# Patient Record
Sex: Male | Born: 1970 | Race: White | Hispanic: No | Marital: Married | Smoking: Never smoker
Health system: Southern US, Community
[De-identification: ages and names within clinical notes are randomized; demographics above are authoritative.]

---

## 2017-08-13 ENCOUNTER — Emergency Department (HOSPITAL_COMMUNITY): Payer: No Typology Code available for payment source

## 2017-08-13 ENCOUNTER — Other Ambulatory Visit: Payer: Self-pay

## 2017-08-13 ENCOUNTER — Emergency Department (HOSPITAL_COMMUNITY)
Admission: EM | Admit: 2017-08-13 | Discharge: 2017-08-13 | Disposition: A | Discharge status: Home / Self Care | Payer: No Typology Code available for payment source | Attending: Emergency Medicine | Admitting: Emergency Medicine

## 2017-08-13 ENCOUNTER — Encounter (HOSPITAL_COMMUNITY): Payer: Self-pay | Admitting: Emergency Medicine

## 2017-08-13 DIAGNOSIS — Y939 Activity, unspecified: Secondary | ICD-10-CM | POA: Insufficient documentation

## 2017-08-13 DIAGNOSIS — W319XXA Contact with unspecified machinery, initial encounter: Secondary | ICD-10-CM | POA: Insufficient documentation

## 2017-08-13 DIAGNOSIS — Y929 Unspecified place or not applicable: Secondary | ICD-10-CM | POA: Insufficient documentation

## 2017-08-13 DIAGNOSIS — Y990 Civilian activity done for income or pay: Secondary | ICD-10-CM | POA: Insufficient documentation

## 2017-08-13 DIAGNOSIS — Z23 Encounter for immunization: Secondary | ICD-10-CM | POA: Diagnosis not present

## 2017-08-13 DIAGNOSIS — S61309A Unspecified open wound of unspecified finger with damage to nail, initial encounter: Secondary | ICD-10-CM

## 2017-08-13 DIAGNOSIS — S61102A Unspecified open wound of left thumb with damage to nail, initial encounter: Secondary | ICD-10-CM | POA: Diagnosis present

## 2017-08-13 MED ORDER — IBUPROFEN 200 MG PO TABS
600.0000 mg | ORAL_TABLET | Freq: Once | ORAL | Status: AC
  Administered 2017-08-13: 600 mg via ORAL
  Filled 2017-08-13: qty 3

## 2017-08-13 MED ORDER — TETANUS-DIPHTH-ACELL PERTUSSIS 5-2.5-18.5 LF-MCG/0.5 IM SUSP
0.5000 mL | Freq: Once | INTRAMUSCULAR | Status: AC
  Administered 2017-08-13: 0.5 mL via INTRAMUSCULAR
  Filled 2017-08-13: qty 0.5

## 2017-08-13 MED ORDER — CEPHALEXIN 500 MG PO CAPS: 500.0000 mg | ORAL_CAPSULE | Freq: Four times a day (QID) | ORAL | 0 refills | Status: AC

## 2017-08-13 MED ORDER — CEPHALEXIN 500 MG PO CAPS
500.0000 mg | ORAL_CAPSULE | Freq: Once | ORAL | Status: AC
  Administered 2017-08-13: 500 mg via ORAL
  Filled 2017-08-13: qty 1

## 2017-08-13 NOTE — ED Provider Notes (Signed)
[facility]-EMERGENCY DEPT Provider Note   CSN: [id number] Arrival date & time: 08/13/17  1951     History   Chief Complaint Chief Complaint  Patient presents with  . Laceration    HPI Shane Ramsey is a 47 y.o. male who presents to the ED for an injury to the right thumb. Patient reports that he was working with a machine and it did not lock properly and when he moved his hand it came down and cut through his right thumbnail. The injury occurred approximately 1915.  HPI  History reviewed. No pertinent past medical history.  There are no active problems to display for this patient.   History reviewed. No pertinent surgical history.      Home Medications    Prior to Admission medications   Medication Sig Start Date End Date Taking? Authorizing Provider  cephALEXin (KEFLEX) 500 MG capsule Take 1 capsule (500 mg total) by mouth 4 (four) times daily. 08/13/17   Janne Napoleon, NP    Family History History reviewed. No pertinent family history.  Social History Social History   Tobacco Use  . Smoking status: Never Smoker  . Smokeless tobacco: Never Used  Substance Use Topics  . Alcohol use: Yes    Frequency: Never  . Drug use: Never     Allergies   Patient has no known allergies.   Review of Systems Review of Systems  Musculoskeletal: Positive for arthralgias.  Skin: Positive for wound.  All other systems reviewed and are negative.    Physical Exam Updated Vital Signs BP (!) 147/95 (BP Location: Left Arm)   Pulse 81   Temp 98.1 F (36.7 C) (Oral)   Resp 18   Ht 6\' 1"  (1.854 m)   Wt 81.6 kg (180 lb)   SpO2 100%   BMI 23.75 kg/m   Physical Exam  Constitutional: He appears well-developed and well-nourished. No distress.  HENT:  Head: Normocephalic.  Eyes: EOM are normal.  Neck: Neck supple.  Cardiovascular: Normal rate.  Pulmonary/Chest: Effort normal.  Musculoskeletal: Normal range of motion.       Right hand: He  exhibits tenderness, laceration and swelling. He exhibits normal range of motion. Deformity: of right thumbnail. Normal sensation noted. Normal strength noted.  Partial avulsion of the nail of the right thumb.  Neurological: He is alert.  Skin: Skin is warm and dry.  Psychiatric: He has a normal mood and affect.  Nursing note and vitals reviewed.    ED Treatments / Results  Labs (all labs ordered are listed, but only abnormal results are displayed) Labs Reviewed - No data to display  Radiology Dg Finger Thumb Left  Result Date: 08/13/2017 CLINICAL DATA:  47 year old male with laceration of the left thumb. EXAM: LEFT THUMB 2+V COMPARISON:  None. FINDINGS: There is no acute fracture or dislocation. The bones are well mineralized. No arthritic changes. The soft tissues are unremarkable. No radiopaque foreign object or soft tissue gas. IMPRESSION: Negative. Electronically Signed   By: Elgie Collard M.D.   On: 08/13/2017 21:08    Procedures: Marland KitchenMarland KitchenLaceration Repair Date/Time: 08/13/2017 9:56 PM Performed by: Janne Napoleon, NP Authorized by: Janne Napoleon, NP   Consent:    Consent obtained:  Verbal   Consent given by:  Patient   Risks discussed:  Infection and poor cosmetic result Anesthesia (see MAR for exact dosages):    Anesthesia method:  None Laceration details:    Location:  Finger   Finger location:  R thumb   Length (cm):  2 Exploration:    Wound extent: no foreign bodies/material noted, no tendon damage noted and no underlying fracture noted     Contaminated: no   Treatment:    Area cleansed with:  Saline   Amount of cleaning:  Standard   Irrigation solution:  Sterile saline Post-procedure details:    Patient tolerance of procedure:  Tolerated well, no immediate complications Comments:     After wound cleaned with NSS quick clot applied to the wound. Sterile nonstick dressing applied.    (including critical care time)  Medications Ordered in ED Medications  Tdap  (BOOSTRIX) injection 0.5 mL (has no administration in time range)  cephALEXin (KEFLEX) capsule 500 mg (has no administration in time range)  ibuprofen (ADVIL,MOTRIN) tablet 600 mg (has no administration in time range)     Initial Impression / Assessment and Plan / ED Course  I have reviewed the triage vital signs and the nursing notes. 47 y.o. male here with right thumb laceration stable for d/c without fracture or dislocation noted on x-ray and bleeding controlled. Patient to return for any problems. Referral to hand given. Will start Keflex.   Final Clinical Impressions(s) / ED Diagnoses   Final diagnoses:  Nail avulsion, finger, initial encounter    ED Discharge Orders        Ordered    cephALEXin (KEFLEX) 500 MG capsule  4 times daily     08/13/17 2150       Kerrie Buffaloeese, [place], [place] 08/13/17 2200    Loren RacerYelverton, David, MD 08/15/17 [phone]

## 2017-08-13 NOTE — Discharge Instructions (Signed)
Take tylenol and ibuprofen for pain in addition to the antibiotic. If your symptoms worse return here or follow up with Dr. Jena GaussHaddix.

## 2017-08-13 NOTE — ED Triage Notes (Signed)
Pt reports being at work and a piece of the equipment did not lock and cut right thumb nail at appx 1915 today. Bleeding controlled with dressing at this time.

## 2018-11-06 IMAGING — CR DG FINGER THUMB 2+V*L*
3 series · 3 of 3 positions shown · non-contrast
Comparison: None.

CLINICAL DATA: 47-year-old male with laceration of the left thumb.

EXAM:
LEFT THUMB 2+V

[x finger obl left]
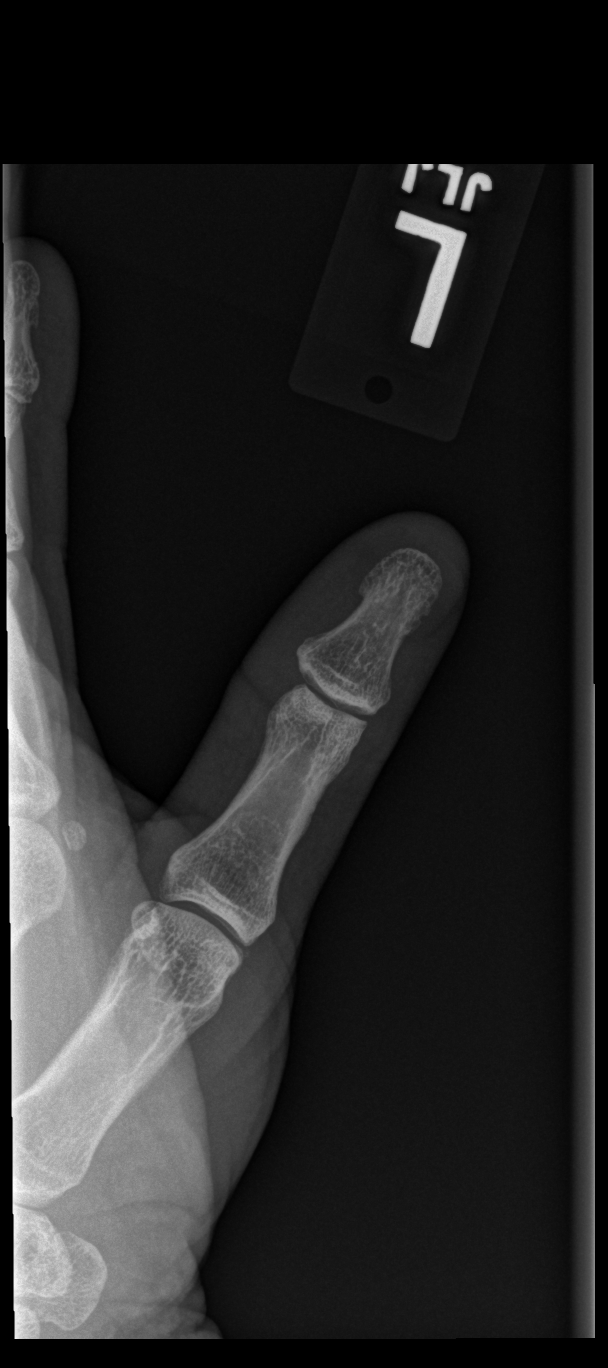

[x finger lat left (1 of 2)]
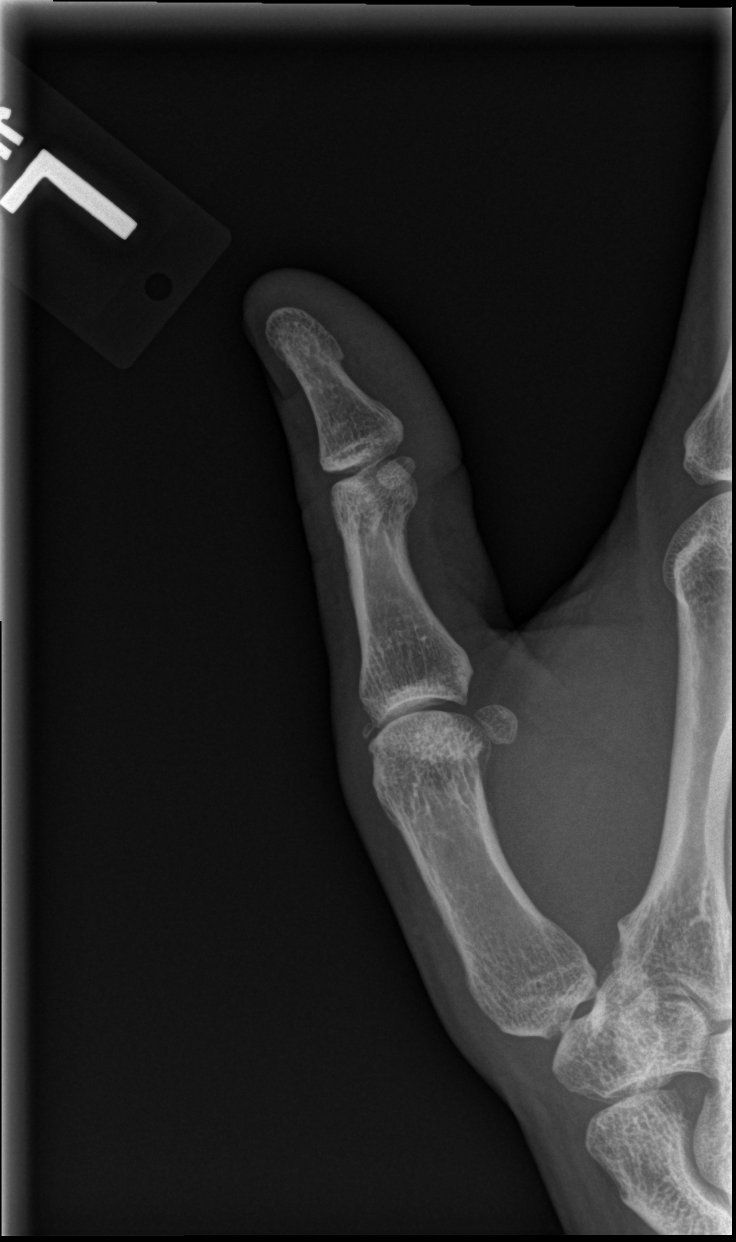

[x finger lat left (2 of 2)]
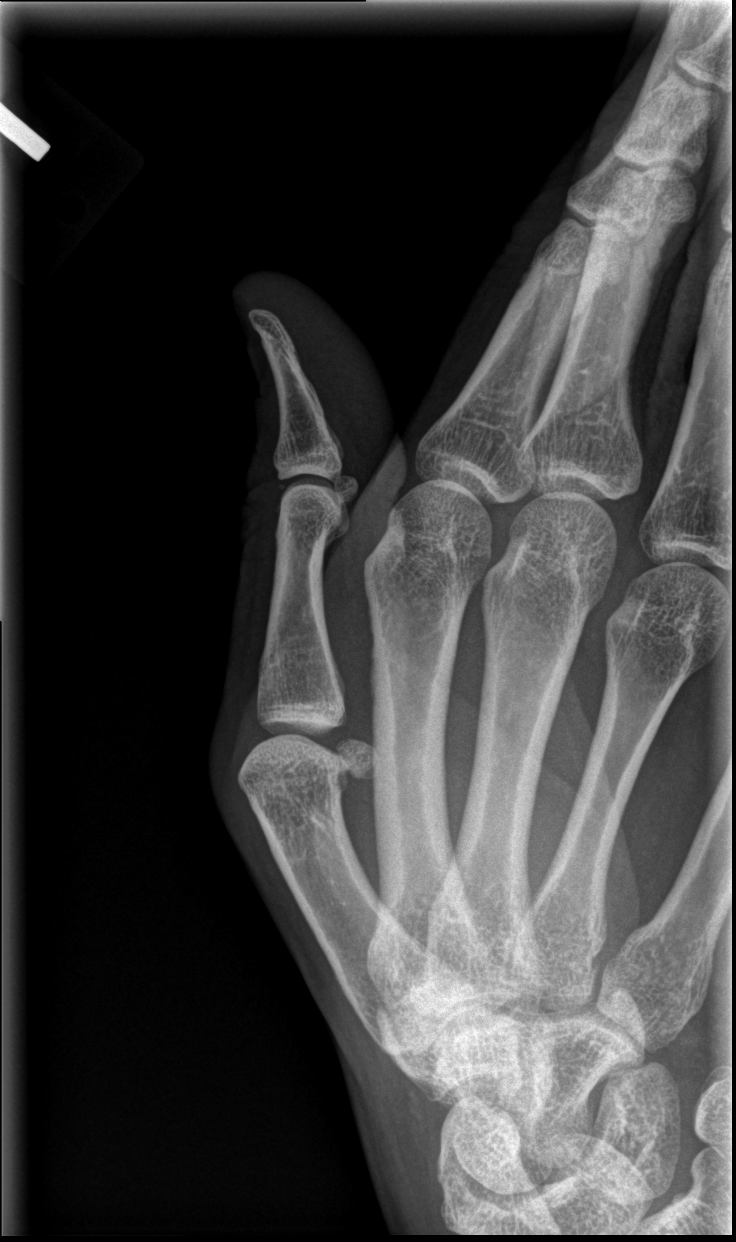

[3 of 3 positions shown; findings below may reference images not displayed]

FINDINGS: There is no acute fracture or dislocation. The bones are well
mineralized. No arthritic changes. The soft tissues are
unremarkable. No radiopaque foreign object or soft tissue gas.
IMPRESSION: Negative.
